# Patient Record
Sex: Female | Born: 1970 | Race: Black or African American | Hispanic: No | Marital: Married | Smoking: Never smoker
Health system: Southern US, Community
[De-identification: ages and names within clinical notes are randomized; demographics above are authoritative.]

## PROBLEM LIST (undated history)

## (undated) DIAGNOSIS — E785 Hyperlipidemia, unspecified: Secondary | ICD-10-CM

## (undated) DIAGNOSIS — N920 Excessive and frequent menstruation with regular cycle: Secondary | ICD-10-CM

## (undated) DIAGNOSIS — R809 Proteinuria, unspecified: Secondary | ICD-10-CM

## (undated) DIAGNOSIS — N051 Unspecified nephritic syndrome with focal and segmental glomerular lesions: Secondary | ICD-10-CM

## (undated) DIAGNOSIS — N184 Chronic kidney disease, stage 4 (severe): Secondary | ICD-10-CM

## (undated) DIAGNOSIS — E559 Vitamin D deficiency, unspecified: Secondary | ICD-10-CM

## (undated) DIAGNOSIS — G43909 Migraine, unspecified, not intractable, without status migrainosus: Secondary | ICD-10-CM

## (undated) DIAGNOSIS — D509 Iron deficiency anemia, unspecified: Secondary | ICD-10-CM

## (undated) DIAGNOSIS — M1990 Unspecified osteoarthritis, unspecified site: Secondary | ICD-10-CM

## (undated) HISTORY — DX: Excessive and frequent menstruation with regular cycle: N92.0

## (undated) HISTORY — DX: Migraine, unspecified, not intractable, without status migrainosus: G43.909

## (undated) HISTORY — DX: Chronic kidney disease, stage 4 (severe): N18.4

## (undated) HISTORY — DX: Unspecified nephritic syndrome with focal and segmental glomerular lesions: N05.1

## (undated) HISTORY — PX: COLONOSCOPY: SHX174

## (undated) HISTORY — DX: Iron deficiency anemia, unspecified: D50.9

## (undated) HISTORY — DX: Unspecified osteoarthritis, unspecified site: M19.90

## (undated) HISTORY — DX: Proteinuria, unspecified: R80.9

## (undated) HISTORY — PX: TUBAL LIGATION: SHX77

## (undated) HISTORY — DX: Hyperlipidemia, unspecified: E78.5

## (undated) HISTORY — DX: Vitamin D deficiency, unspecified: E55.9

---
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
## 1991-06-03 HISTORY — PX: WISDOM TOOTH EXTRACTION: SHX21

## 1999-01-09 ENCOUNTER — Other Ambulatory Visit: Admission: RE | Admit: 1999-01-09 | Discharge: 1999-01-09 | Payer: Self-pay | Admitting: Obstetrics and Gynecology

## 1999-01-10 ENCOUNTER — Other Ambulatory Visit: Admission: RE | Admit: 1999-01-10 | Discharge: 1999-01-10 | Payer: Self-pay | Admitting: *Deleted

## 1999-02-14 ENCOUNTER — Ambulatory Visit (HOSPITAL_COMMUNITY): Admission: RE | Admit: 1999-02-14 | Discharge: 1999-02-14 | Payer: Self-pay | Admitting: Obstetrics & Gynecology

## 2001-12-22 ENCOUNTER — Other Ambulatory Visit: Admission: RE | Admit: 2001-12-22 | Discharge: 2001-12-22 | Payer: Self-pay | Admitting: Obstetrics and Gynecology

## 2003-01-12 ENCOUNTER — Other Ambulatory Visit: Admission: RE | Admit: 2003-01-12 | Discharge: 2003-01-12 | Payer: Self-pay | Admitting: Obstetrics and Gynecology

## 2004-01-30 ENCOUNTER — Ambulatory Visit (HOSPITAL_COMMUNITY): Admission: RE | Admit: 2004-01-30 | Discharge: 2004-01-30 | Payer: Self-pay | Admitting: Obstetrics and Gynecology

## 2004-02-13 ENCOUNTER — Inpatient Hospital Stay (HOSPITAL_COMMUNITY): Admission: AD | Admit: 2004-02-13 | Discharge: 2004-02-17 | Payer: Self-pay | Admitting: Obstetrics and Gynecology

## 2004-03-06 ENCOUNTER — Encounter: Admission: RE | Admit: 2004-03-06 | Discharge: 2004-04-05 | Payer: Self-pay | Admitting: Obstetrics and Gynecology

## 2004-03-06 ENCOUNTER — Other Ambulatory Visit: Admission: RE | Admit: 2004-03-06 | Discharge: 2004-03-06 | Payer: Self-pay | Admitting: Obstetrics and Gynecology

## 2005-03-11 ENCOUNTER — Other Ambulatory Visit: Admission: RE | Admit: 2005-03-11 | Discharge: 2005-03-11 | Payer: Self-pay | Admitting: Obstetrics and Gynecology

## 2006-04-22 ENCOUNTER — Inpatient Hospital Stay (HOSPITAL_COMMUNITY): Admission: RE | Admit: 2006-04-22 | Discharge: 2006-04-25 | Payer: Self-pay | Admitting: Obstetrics and Gynecology

## 2006-04-26 ENCOUNTER — Encounter: Admission: RE | Admit: 2006-04-26 | Discharge: 2006-05-25 | Payer: Self-pay | Admitting: Obstetrics and Gynecology

## 2006-05-26 ENCOUNTER — Encounter: Admission: RE | Admit: 2006-05-26 | Discharge: 2006-06-25 | Payer: Self-pay | Admitting: Obstetrics and Gynecology

## 2006-06-26 ENCOUNTER — Encounter: Admission: RE | Admit: 2006-06-26 | Discharge: 2006-07-26 | Payer: Self-pay | Admitting: Obstetrics and Gynecology

## 2006-07-27 ENCOUNTER — Encounter: Admission: RE | Admit: 2006-07-27 | Discharge: 2006-08-17 | Payer: Self-pay | Admitting: Obstetrics and Gynecology

## 2010-08-26 ENCOUNTER — Other Ambulatory Visit: Payer: Self-pay | Admitting: Obstetrics and Gynecology

## 2010-10-22 ENCOUNTER — Other Ambulatory Visit: Payer: Self-pay | Admitting: Dermatology

## 2011-08-28 ENCOUNTER — Other Ambulatory Visit: Payer: Self-pay | Admitting: Obstetrics and Gynecology

## 2012-01-27 ENCOUNTER — Encounter: Payer: Self-pay | Admitting: Internal Medicine

## 2012-03-05 ENCOUNTER — Encounter: Payer: Self-pay | Admitting: Internal Medicine

## 2012-03-05 ENCOUNTER — Ambulatory Visit (AMBULATORY_SURGERY_CENTER): Payer: BC Managed Care – PPO | Admitting: *Deleted

## 2012-03-05 VITALS — Ht 61.0 in | Wt 135.0 lb

## 2012-03-05 DIAGNOSIS — Z1211 Encounter for screening for malignant neoplasm of colon: Secondary | ICD-10-CM

## 2012-03-05 MED ORDER — SUPREP BOWEL PREP KIT 17.5-3.13-1.6 GM/177ML PO SOLN: ORAL | Status: DC

## 2012-03-18 ENCOUNTER — Encounter: Payer: Self-pay | Admitting: Internal Medicine

## 2012-03-18 ENCOUNTER — Ambulatory Visit (AMBULATORY_SURGERY_CENTER): Payer: BC Managed Care – PPO | Admitting: Internal Medicine

## 2012-03-18 VITALS — BP 111/83 | HR 61 | Temp 98.3°F | Resp 22 | Ht 61.0 in | Wt 135.0 lb

## 2012-03-18 DIAGNOSIS — Z1211 Encounter for screening for malignant neoplasm of colon: Secondary | ICD-10-CM

## 2012-03-18 DIAGNOSIS — Z800 Family history of malignant neoplasm of digestive organs: Secondary | ICD-10-CM

## 2012-03-18 MED ORDER — SODIUM CHLORIDE 0.9 % IV SOLN: 500.0000 mL | INTRAVENOUS | Status: DC

## 2012-03-18 NOTE — Op Note (Signed)
[place]  [address] [place], [address]   COLONOSCOPY PROCEDURE REPORT  PATIENT: Katie, Zamora  MR#: [id number] BIRTHDATE: 1971/04/06 , 41  yrs. old GENDER: Female ENDOSCOPIST: Gatha Mayer, MD, [facility] REFERRED [id number] Tisovec, M.D. PROCEDURE DATE:  03/18/2012 PROCEDURE:   Colonoscopy, screening ASA CLASS:   Class I INDICATIONS:elevated risk screening and patient's immediate family history of colon cancer. MEDICATIONS: propofol (Diprivan) 200mg  IV, MAC sedation, administered by CRNA, and These medications were titrated to patient response per physician's verbal order  DESCRIPTION OF PROCEDURE:   After the risks benefits and alternatives of the procedure were thoroughly explained, informed consent was obtained.  A digital rectal exam revealed no abnormalities of the rectum.   The LB [id number]-[id number]  endoscope was introduced through the anus and advanced to the cecum, which was identified by both the appendix and ileocecal valve. No adverse events experienced.   The quality of the prep was Suprep excellent The instrument was then slowly withdrawn as the colon was fully examined.      COLON FINDINGS: The colonic mucosa appeared normal throughout the entire examined colon.  Retroflexed views revealed no abnormalities. The time to cecum=1 minutes 23 seconds.  Withdrawal time=7 minutes 16 seconds.  The scope was withdrawn and the procedure completed. COMPLICATIONS: There were no complications.  ENDOSCOPIC IMPRESSION: The colonic mucosa appeared normal throughout the entire examined colon - normal colonoscopy  RECOMMENDATIONS: repeat Colonscopy in 10 years. (+ Family history but father > 37 at dx)   eSigned:  Gatha Mayer, MD, [facility] 03/18/2012 10:56 AM   cc: Domenick Gong, MD and The Patient

## 2012-03-18 NOTE — Progress Notes (Signed)
Patient did not experience any of the following events: a burn prior to discharge; a fall within the facility; wrong site/side/patient/procedure/implant event; or a hospital transfer or hospital admission upon discharge from the facility. (G8907) Patient did not have preoperative order for IV antibiotic SSI prophylaxis. (G8918)  

## 2012-03-18 NOTE — Patient Instructions (Addendum)
No polyps or cancer seen! Next colonoscopy in 10 years - 2023.  Thank you for choosing me and [facility].  Gatha Mayer, MD, FACG YOU HAD AN ENDOSCOPIC PROCEDURE TODAY AT [facility]: Refer to the procedure report that was given to you for any specific questions about what was found during the examination.  If the procedure report does not answer your questions, please call your gastroenterologist to clarify.  If you requested that your care partner not be given the details of your procedure findings, then the procedure report has been included in a sealed envelope for you to review at your convenience later.  YOU SHOULD EXPECT: Some feelings of bloating in the abdomen. Passage of more gas than usual.  Walking can help get rid of the air that was put into your GI tract during the procedure and reduce the bloating. If you had a lower endoscopy (such as a colonoscopy or flexible sigmoidoscopy) you may notice spotting of blood in your stool or on the toilet paper. If you underwent a bowel prep for your procedure, then you may not have a normal bowel movement for a few days.  DIET: Your first meal following the procedure should be a light meal and then it is ok to progress to your normal diet.  A half-sandwich or bowl of soup is an example of a good first meal.  Heavy or fried foods are harder to digest and may make you feel nauseous or bloated.  Likewise meals heavy in dairy and vegetables can cause extra gas to form and this can also increase the bloating.  Drink plenty of fluids but you should avoid alcoholic beverages for 24 hours.  ACTIVITY: Your care partner should take you home directly after the procedure.  You should plan to take it easy, moving slowly for the rest of the day.  You can resume normal activity the day after the procedure however you should NOT DRIVE or use heavy machinery for 24 hours (because of the sedation medicines used during the test).     SYMPTOMS TO REPORT IMMEDIATELY: A gastroenterologist can be reached at any hour.  During normal business hours, 8:30 AM to 5:00 PM Monday through Friday, call [phone].  After hours and on weekends, please call the GI answering service at [phone] who will take a message and have the physician on call contact you.   Following lower endoscopy (colonoscopy or flexible sigmoidoscopy):  Excessive amounts of blood in the stool  Significant tenderness or worsening of abdominal pains  Swelling of the abdomen that is new, acute  Fever of 100F or higher   FOLLOW UP: If any biopsies were taken you will be contacted by phone or by letter within the next 1-3 weeks.  Call your gastroenterologist if you have not heard about the biopsies in 3 weeks.  Our staff will call the home number listed on your records the next business day following your procedure to check on you and address any questions or concerns that you may have at that time regarding the information given to you following your procedure. This is a courtesy call and so if there is no answer at the home number and we have not heard from you through the emergency physician on call, we will assume that you have returned to your regular daily activities without incident.  SIGNATURES/CONFIDENTIALITY: You and/or your care partner have signed paperwork which will be entered into your electronic medical record.  These signatures  attest to the fact that that the information above on your After Visit Summary has been reviewed and is understood.  Full responsibility of the confidentiality of this discharge information lies with you and/or your care-partner.   Next colonoscopy 10 years-2023

## 2012-03-19 ENCOUNTER — Telehealth: Payer: Self-pay | Admitting: *Deleted

## 2012-03-19 NOTE — Telephone Encounter (Signed)
No answer left message to call if questions or concerns. 

## 2012-08-31 ENCOUNTER — Other Ambulatory Visit: Payer: Self-pay | Admitting: Obstetrics and Gynecology

## 2013-09-28 ENCOUNTER — Other Ambulatory Visit: Payer: Self-pay | Admitting: Obstetrics and Gynecology

## 2014-11-09 ENCOUNTER — Other Ambulatory Visit: Payer: Self-pay | Admitting: Obstetrics and Gynecology

## 2014-11-10 LAB — CYTOLOGY - PAP

## 2015-04-10 ENCOUNTER — Other Ambulatory Visit: Payer: Self-pay | Admitting: Obstetrics and Gynecology

## 2015-11-15 ENCOUNTER — Other Ambulatory Visit: Payer: Self-pay | Admitting: Obstetrics and Gynecology

## 2015-11-16 LAB — CYTOLOGY - PAP

## 2017-03-16 ENCOUNTER — Other Ambulatory Visit: Payer: Self-pay | Admitting: Nephrology

## 2017-03-16 DIAGNOSIS — R809 Proteinuria, unspecified: Secondary | ICD-10-CM

## 2017-03-18 ENCOUNTER — Ambulatory Visit
Admission: RE | Admit: 2017-03-18 | Discharge: 2017-03-18 | Disposition: A | Discharge status: Home / Self Care | Payer: BLUE CROSS/BLUE SHIELD | Source: Ambulatory Visit | Attending: Nephrology | Admitting: Nephrology

## 2017-03-18 DIAGNOSIS — R809 Proteinuria, unspecified: Secondary | ICD-10-CM

## 2017-04-02 ENCOUNTER — Other Ambulatory Visit (HOSPITAL_COMMUNITY)
Admission: RE | Admit: 2017-04-02 | Discharge: 2017-04-02 | Disposition: A | Discharge status: Home / Self Care | Payer: BLUE CROSS/BLUE SHIELD | Source: Ambulatory Visit | Attending: Nephrology | Admitting: Nephrology

## 2017-04-02 DIAGNOSIS — Z01812 Encounter for preprocedural laboratory examination: Secondary | ICD-10-CM | POA: Insufficient documentation

## 2017-04-02 LAB — APTT: APTT: 34 s (ref 24–36)

## 2017-04-02 LAB — PLATELET FUNCTION ASSAY: Collagen / Epinephrine: 121 seconds (ref 0–193)

## 2017-04-02 LAB — PROTIME-INR
INR: 0.91
Prothrombin Time: 12.2 seconds (ref 11.4–15.2)

## 2017-04-06 ENCOUNTER — Other Ambulatory Visit (HOSPITAL_COMMUNITY): Payer: Self-pay | Admitting: Nephrology

## 2017-04-06 DIAGNOSIS — R809 Proteinuria, unspecified: Secondary | ICD-10-CM

## 2017-04-06 DIAGNOSIS — I10 Essential (primary) hypertension: Secondary | ICD-10-CM

## 2017-04-08 ENCOUNTER — Other Ambulatory Visit: Payer: Self-pay | Admitting: Radiology

## 2017-04-10 ENCOUNTER — Other Ambulatory Visit: Payer: Self-pay | Admitting: Radiology

## 2017-04-13 ENCOUNTER — Encounter (HOSPITAL_COMMUNITY): Payer: Self-pay

## 2017-04-13 ENCOUNTER — Ambulatory Visit (HOSPITAL_COMMUNITY)
Admission: RE | Admit: 2017-04-13 | Discharge: 2017-04-13 | Disposition: A | Discharge status: Home / Self Care | Payer: BLUE CROSS/BLUE SHIELD | Source: Ambulatory Visit | Attending: Nephrology | Admitting: Nephrology

## 2017-04-13 DIAGNOSIS — Z803 Family history of malignant neoplasm of breast: Secondary | ICD-10-CM | POA: Insufficient documentation

## 2017-04-13 DIAGNOSIS — N269 Renal sclerosis, unspecified: Secondary | ICD-10-CM | POA: Insufficient documentation

## 2017-04-13 DIAGNOSIS — Z800 Family history of malignant neoplasm of digestive organs: Secondary | ICD-10-CM | POA: Diagnosis not present

## 2017-04-13 DIAGNOSIS — Z79899 Other long term (current) drug therapy: Secondary | ICD-10-CM | POA: Diagnosis not present

## 2017-04-13 DIAGNOSIS — Z885 Allergy status to narcotic agent status: Secondary | ICD-10-CM | POA: Insufficient documentation

## 2017-04-13 DIAGNOSIS — R809 Proteinuria, unspecified: Secondary | ICD-10-CM | POA: Diagnosis present

## 2017-04-13 DIAGNOSIS — I10 Essential (primary) hypertension: Secondary | ICD-10-CM

## 2017-04-13 DIAGNOSIS — Z880 Allergy status to penicillin: Secondary | ICD-10-CM | POA: Insufficient documentation

## 2017-04-13 LAB — PROTIME-INR
INR: 0.88
Prothrombin Time: 11.8 seconds (ref 11.4–15.2)

## 2017-04-13 LAB — CBC
HEMATOCRIT: 38.1 % (ref 36.0–46.0)
HEMOGLOBIN: 12.4 g/dL (ref 12.0–15.0)
MCH: 28.2 pg (ref 26.0–34.0)
MCHC: 32.5 g/dL (ref 30.0–36.0)
MCV: 86.8 fL (ref 78.0–100.0)
Platelets: 393 10*3/uL (ref 150–400)
RBC: 4.39 MIL/uL (ref 3.87–5.11)
RDW: 15.6 % — AB (ref 11.5–15.5)
WBC: 6.7 10*3/uL (ref 4.0–10.5)

## 2017-04-13 LAB — APTT: APTT: 35 s (ref 24–36)

## 2017-04-13 MED ORDER — FENTANYL CITRATE (PF) 100 MCG/2ML IJ SOLN
INTRAMUSCULAR | Status: AC | PRN
  Administered 2017-04-13: 50 ug via INTRAVENOUS
  Administered 2017-04-13: 25 ug via INTRAVENOUS

## 2017-04-13 MED ORDER — LIDOCAINE HCL (PF) 1 % IJ SOLN
INTRAMUSCULAR | Status: AC
  Filled 2017-04-13: qty 30

## 2017-04-13 MED ORDER — MIDAZOLAM HCL 2 MG/2ML IJ SOLN
INTRAMUSCULAR | Status: AC | PRN
  Administered 2017-04-13: 1 mg via INTRAVENOUS
  Administered 2017-04-13 (×2): 0.5 mg via INTRAVENOUS

## 2017-04-13 MED ORDER — SODIUM CHLORIDE 0.9 % IV SOLN: INTRAVENOUS | Status: DC

## 2017-04-13 MED ORDER — FENTANYL CITRATE (PF) 100 MCG/2ML IJ SOLN
INTRAMUSCULAR | Status: AC
  Filled 2017-04-13: qty 2

## 2017-04-13 MED ORDER — MIDAZOLAM HCL 2 MG/2ML IJ SOLN
INTRAMUSCULAR | Status: AC
  Filled 2017-04-13: qty 2

## 2017-04-13 MED ORDER — SODIUM CHLORIDE 0.9 % IV SOLN
INTRAVENOUS | Status: AC | PRN
  Administered 2017-04-13: 10 mL/h via INTRAVENOUS

## 2017-04-13 NOTE — Procedures (Signed)
Interventional Radiology Procedure Note  Procedure: US guided core biopsy of left kidney  Complications: None  Estimated Blood Loss: < 10 mL  16 G core biopsy x 2 of LP cortex of left kidney.  Venetia Night. Kathlene Cote, M.D Pager:  [phone]

## 2017-04-13 NOTE — Sedation Documentation (Signed)
Called to give report. Nurse unavailable. Will call back 

## 2017-04-13 NOTE — H&P (Signed)
Chief Complaint: Patient was seen in consultation today for renal biopsy at the request of [place]  Referring Physician(s): [place]  Supervising Physician: Aletta Edouard  Patient Status: [facility] - Out-pt  History of Present Illness: Katie Zamora is a 46 y.o. female being worked up for proteinuria. She is referred for random renal biopsy. PMHx, meds, labs, imaging reviewed. Has been NPO this am Feels well  History reviewed. No pertinent past medical history.  Past Surgical History:  Procedure Laterality Date  . APPENDECTOMY  1985  . CESAREAN SECTION  2005  . CESAREAN SECTION W/BTL  2007  . TUBAL LIGATION    . WISDOM TOOTH EXTRACTION  1993    Allergies: Codeine and Penicillins  Medications: Prior to Admission medications   Medication Sig Start Date End Date Taking? Authorizing Provider  atorvastatin (LIPITOR) 10 MG tablet Take 10 mg daily at 6 PM by mouth.   Yes [provider]     Family History  Problem Relation Age of Onset  . Breast cancer Mother   . Colon cancer Father 62  . Colon polyps Maternal Grandmother     Social History   Socioeconomic History  . Marital status: Married    Spouse name: None  . Number of children: None  . Years of education: None  . Highest education level: None  Social Needs  . Financial resource strain: None  . Food insecurity - worry: None  . Food insecurity - inability: None  . Transportation needs - medical: None  . Transportation needs - non-medical: None  Occupational History  . None  Tobacco Use  . Smoking status: Never Smoker  . Smokeless tobacco: Never Used  Substance and Sexual Activity  . Alcohol use: No  . Drug use: No  . Sexual activity: None  Other Topics Concern  . None  Social History Narrative  . None     Review of Systems: A 12 point ROS discussed and pertinent positives are indicated in the HPI above.  All other systems are negative.  Review of  Systems  Vital Signs: BP 131/90   Pulse (!) 58   Temp 98 F (36.7 C) (Oral)   Resp 16   Ht 5\' 2"  (1.575 m)   Wt 143 lb (64.9 kg)   LMP 04/01/2017 (Exact Date) Comment: tubal ligation  SpO2 100%   BMI 26.16 kg/m   Physical Exam  Constitutional: She is oriented to person, place, and time. She appears well-developed. No distress.  HENT:  Head: Normocephalic.  Mouth/Throat: Oropharynx is clear and moist.  Neck: Normal range of motion. No JVD present. No tracheal deviation present.  Cardiovascular: Normal rate, regular rhythm and normal heart sounds.  Pulmonary/Chest: Effort normal and breath sounds normal. No respiratory distress.  Abdominal: Soft. She exhibits no mass. There is no tenderness.  Neurological: She is alert and oriented to person, place, and time.  Skin: Skin is warm and dry.  Psychiatric: She has a normal mood and affect.    Imaging: US Renal  Result Date: 03/18/2017 CLINICAL DATA:  Protein urea without hematuria.  No abdominal pain. EXAM: RENAL / URINARY TRACT ULTRASOUND COMPLETE COMPARISON:  None in PACs FINDINGS: Right Kidney: Length: 11.0 cm. The renal cortical echotexture remains lower than that of the adjacent liver. There is no hydronephrosis nor cystic or solid mass. Left Kidney: Length: 10.2 cm. The renal cortical echotexture is similar to that on the right. There is a shadowing calcification in the lateral aspect of the midpole measuring  1.3 cm in diameter. There is an additional shadowing stone measuring 0.9 cm in diameter that lies in the lower pole. There is no hydronephrosis nor cystic or solid mass. Bladder: Appears normal for degree of bladder distention. Bilateral ureteral jets are observed. IMPRESSION: Nonobstructing stones in the left kidney. Normal appearing renal cortical echotexture bilaterally. No hydronephrosis. Electronically Signed   By: David  Martinique M.D.   On: 03/18/2017 16:37    Labs:  CBC: Recent Labs    04/13/17 0610  WBC 6.7  HGB  12.4  HCT 38.1  PLT 393    COAGS: Recent Labs    04/02/17 1040 04/13/17 0610  INR 0.91 0.88  APTT 34 35    Assessment and Plan: Proteinuria For US guided random renal biopsy Labs ok Risks and benefits discussed with the patient including, but not limited to bleeding, infection, damage to adjacent structures or low yield requiring additional tests. All of the patient's questions were answered, patient is agreeable to proceed. Consent signed and in chart.     Thank you for this interesting consult.  I greatly enjoyed meeting CATALIA MASSETT and look forward to participating in their care.  A copy of this report was sent to the requesting provider on this date.  Electronically Signed: Ascencion Dike, PA-C 04/13/2017, 7:23 AM   I spent a total of 20 minutes in face to face in clinical consultation, greater than 50% of which was counseling/coordinating care for renal biopsy

## 2017-04-13 NOTE — Discharge Instructions (Addendum)

## 2017-04-20 ENCOUNTER — Other Ambulatory Visit: Payer: Self-pay | Admitting: Nephrology

## 2017-04-20 DIAGNOSIS — R109 Unspecified abdominal pain: Secondary | ICD-10-CM

## 2017-04-22 ENCOUNTER — Encounter (HOSPITAL_COMMUNITY): Payer: Self-pay

## 2017-04-27 ENCOUNTER — Encounter (HOSPITAL_COMMUNITY): Payer: Self-pay

## 2017-04-27 ENCOUNTER — Other Ambulatory Visit: Payer: BLUE CROSS/BLUE SHIELD

## 2019-01-20 ENCOUNTER — Other Ambulatory Visit: Payer: Self-pay | Admitting: Obstetrics and Gynecology

## 2019-01-20 DIAGNOSIS — R928 Other abnormal and inconclusive findings on diagnostic imaging of breast: Secondary | ICD-10-CM

## 2019-01-26 ENCOUNTER — Ambulatory Visit
Admission: RE | Admit: 2019-01-26 | Discharge: 2019-01-26 | Disposition: A | Discharge status: Home / Self Care | Payer: BC Managed Care – PPO | Source: Ambulatory Visit | Attending: Obstetrics and Gynecology | Admitting: Obstetrics and Gynecology

## 2019-01-26 ENCOUNTER — Other Ambulatory Visit: Payer: Self-pay | Admitting: Obstetrics and Gynecology

## 2019-01-26 ENCOUNTER — Other Ambulatory Visit: Payer: Self-pay

## 2019-01-26 ENCOUNTER — Ambulatory Visit
Admission: RE | Admit: 2019-01-26 | Discharge: 2019-01-26 | Disposition: A | Discharge status: Home / Self Care | Payer: BLUE CROSS/BLUE SHIELD | Source: Ambulatory Visit | Attending: Obstetrics and Gynecology | Admitting: Obstetrics and Gynecology

## 2019-01-26 DIAGNOSIS — R928 Other abnormal and inconclusive findings on diagnostic imaging of breast: Secondary | ICD-10-CM

## 2019-01-26 DIAGNOSIS — N6489 Other specified disorders of breast: Secondary | ICD-10-CM

## 2019-02-22 IMAGING — US US BIOPSY
1 series · 6 of 6 positions shown · non-contrast
Comparison: none

CLINICAL DATA: Proteinuria and need for renal biopsy.

[Series 1: us biopsy · 0.20mm/px · 6 of 6 slices shown]
[im 1/6]
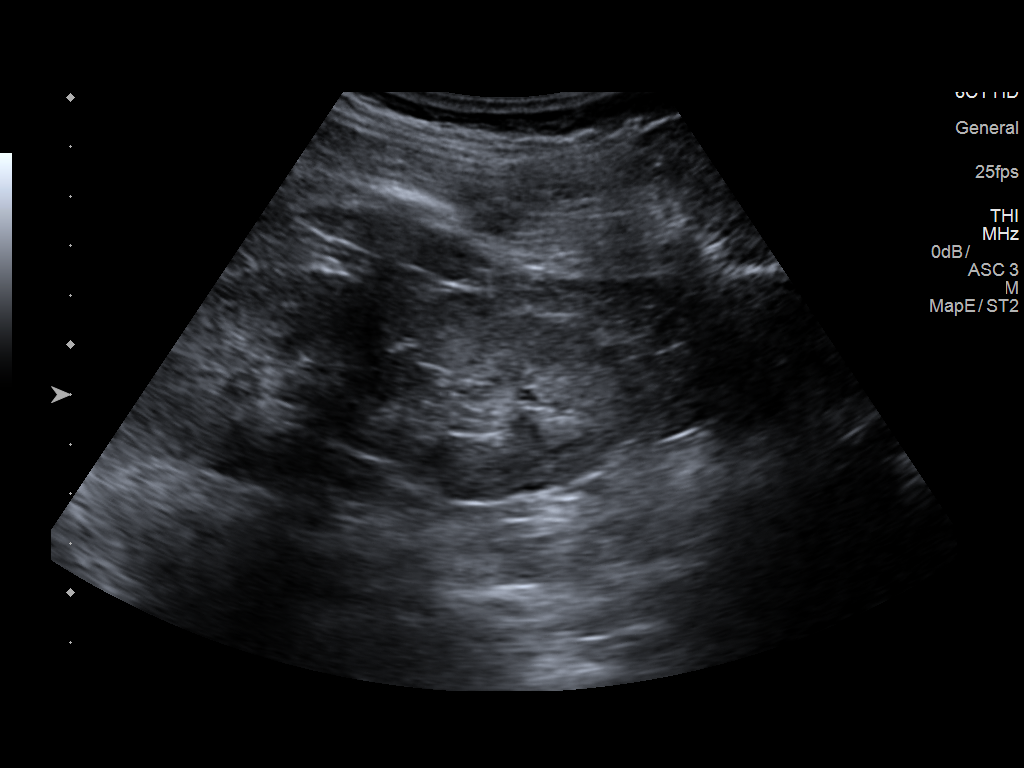
[im 2/6]
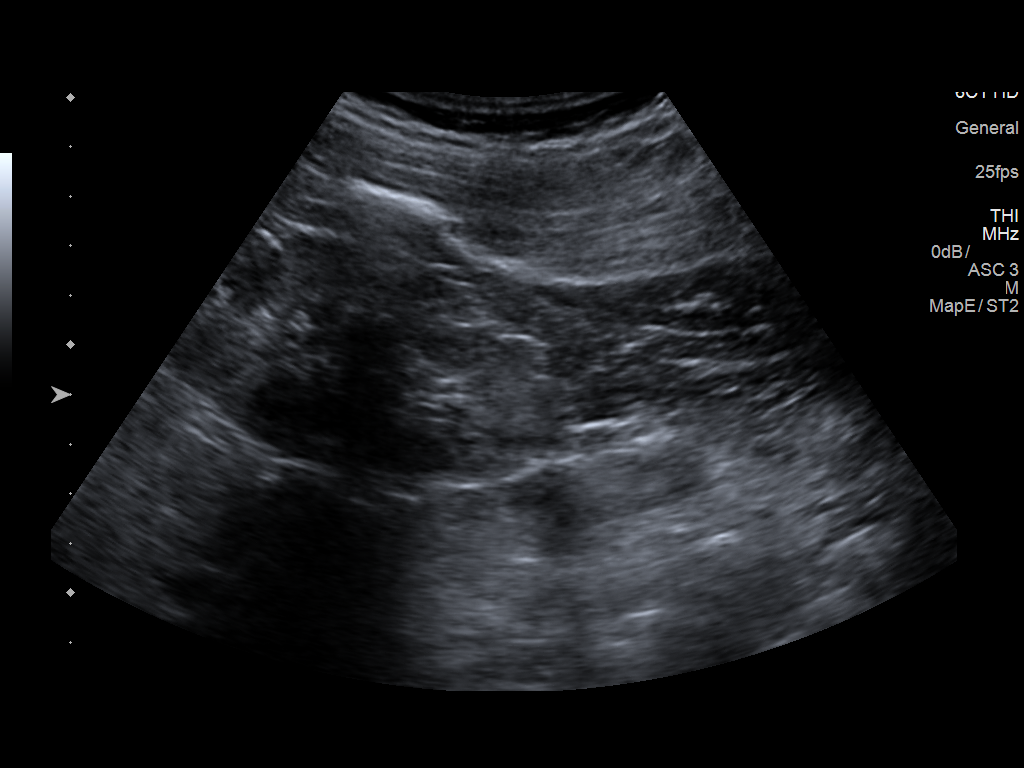
[im 3/6]
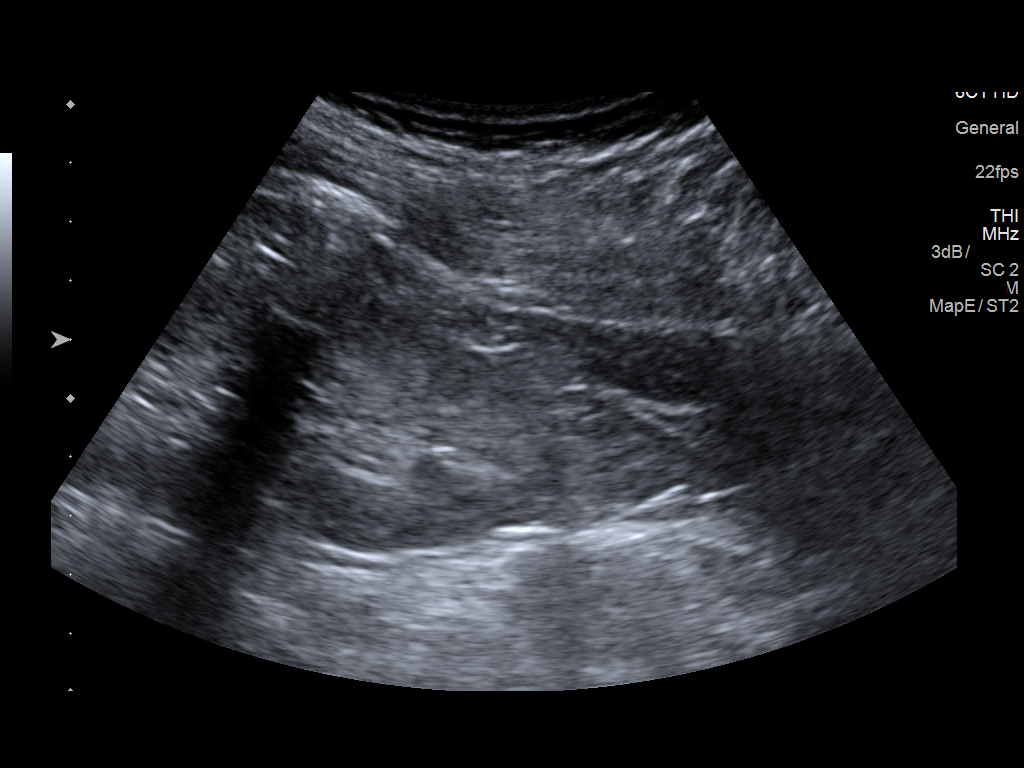
[im 4/6]
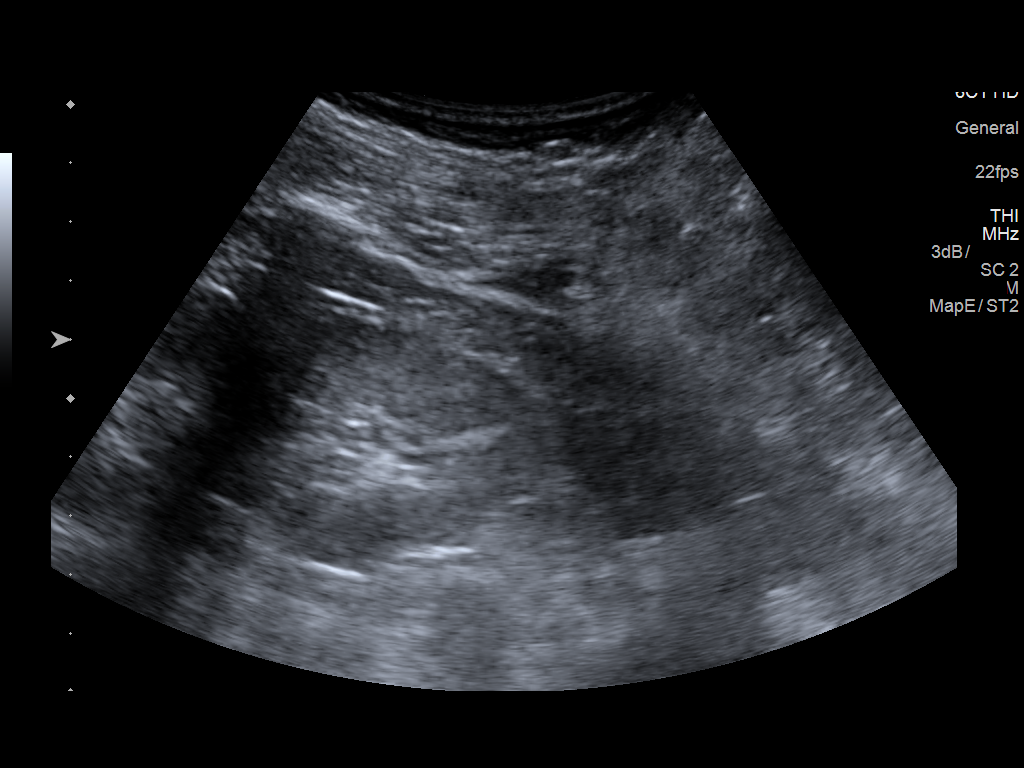
[im 5/6]
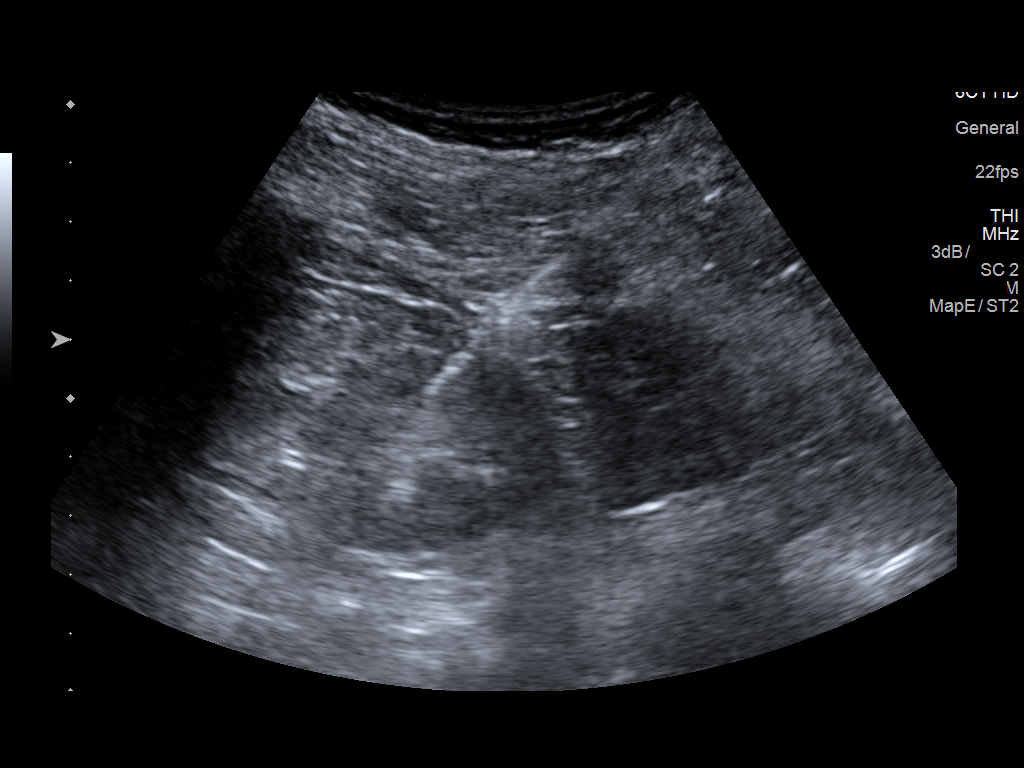
[im 6/6]
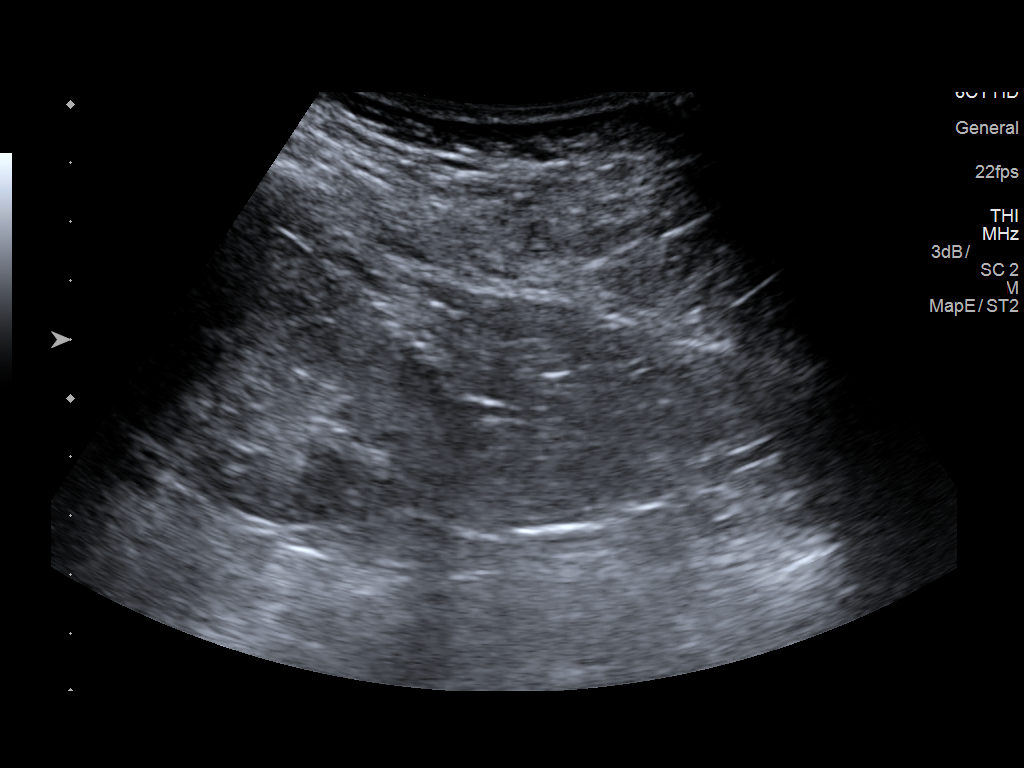

[6 of 6 positions shown; findings below may reference images not displayed]

EXAM:
ULTRASOUND GUIDED CORE BIOPSY OF LEFT KIDNEY

MEDICATIONS:
2.0 mg IV Versed; 75 mcg IV Fentanyl

Total Moderate Sedation Time: 12 minutes.

The patient's level of consciousness and physiologic status were
continuously monitored during the procedure by Radiology nursing.

PROCEDURE:
The procedure, risks, benefits, and alternatives were explained to
the patient. Questions regarding the procedure were encouraged and
answered. The patient understands and consents to the procedure. A
time out was performed prior to initiating the procedure.

Both kidneys were examined by ultrasound. The left was chosen for
biopsy. The left flank region was prepped with chlorhexidine in a
sterile fashion, and a sterile drape was applied covering the
operative field. A sterile gown and sterile gloves were used for the
procedure. Local anesthesia was provided with 1% Lidocaine.

Two separate 16 gauge core biopsy samples were obtained from lower
pole cortex of the left kidney. Core samples were submitted in
saline.

COMPLICATIONS:
None.
FINDINGS: The left kidney was better visualized and more superficially located
compared to the right. Solid core samples were obtained.
IMPRESSION: Ultrasound-guided core biopsy performed of the left kidney at the
level of lower pole cortex.

## 2019-07-25 ENCOUNTER — Ambulatory Visit
Admission: RE | Admit: 2019-07-25 | Discharge: 2019-07-25 | Disposition: A | Discharge status: Home / Self Care | Payer: 59 | Source: Ambulatory Visit | Attending: Obstetrics and Gynecology | Admitting: Obstetrics and Gynecology

## 2019-07-25 ENCOUNTER — Other Ambulatory Visit: Payer: Self-pay

## 2019-07-25 DIAGNOSIS — N6489 Other specified disorders of breast: Secondary | ICD-10-CM

## 2019-08-27 ENCOUNTER — Ambulatory Visit: Payer: 59

## 2020-03-19 ENCOUNTER — Telehealth: Payer: Self-pay | Admitting: Internal Medicine

## 2020-03-19 NOTE — Telephone Encounter (Signed)
Patient will need office visit

## 2020-03-19 NOTE — Telephone Encounter (Signed)
Pt has a recall for a colonoscopy 03/2022, but pt is being referred from [facility] to have a colonoscopy done now for a kidney disease.

## 2020-03-31 ENCOUNTER — Ambulatory Visit: Payer: No Typology Code available for payment source | Attending: Internal Medicine

## 2020-03-31 DIAGNOSIS — Z23 Encounter for immunization: Secondary | ICD-10-CM

## 2020-03-31 NOTE — Progress Notes (Signed)
   Covid-19 Vaccination Clinic  Name:  Katie Zamora    MRN: [id number] DOB: 1971/01/20  03/31/2020  Katie Zamora was observed post Covid-19 immunization for 15 minutes without incident. She was provided with Vaccine Information Sheet and instruction to access the V-Safe system.   Katie Zamora was instructed to call 911 with any severe reactions post vaccine: Marland Kitchen Difficulty breathing  . Swelling of face and throat  . A fast heartbeat  . A bad rash all over body  . Dizziness and weakness

## 2020-06-11 ENCOUNTER — Encounter: Payer: Self-pay | Admitting: Internal Medicine

## 2020-06-11 ENCOUNTER — Ambulatory Visit: Payer: No Typology Code available for payment source | Admitting: Internal Medicine

## 2020-06-11 VITALS — BP 124/78 | HR 70 | Ht 61.0 in | Wt 134.0 lb

## 2020-06-11 DIAGNOSIS — D509 Iron deficiency anemia, unspecified: Secondary | ICD-10-CM | POA: Insufficient documentation

## 2020-06-11 DIAGNOSIS — Z800 Family history of malignant neoplasm of digestive organs: Secondary | ICD-10-CM | POA: Diagnosis not present

## 2020-06-11 DIAGNOSIS — N184 Chronic kidney disease, stage 4 (severe): Secondary | ICD-10-CM | POA: Diagnosis not present

## 2020-06-11 DIAGNOSIS — N921 Excessive and frequent menstruation with irregular cycle: Secondary | ICD-10-CM

## 2020-06-11 MED ORDER — PEG-KCL-NACL-NASULF-NA ASC-C 100 G PO SOLR: 1.0000 | Freq: Once | ORAL | 0 refills | Status: AC

## 2020-06-11 MED ORDER — PEG-KCL-NACL-NASULF-NA ASC-C 100 G PO SOLR: 1.0000 | Freq: Once | ORAL | 0 refills | Status: DC

## 2020-06-11 NOTE — Patient Instructions (Signed)
COLONOSCOPY AND ENDOSCOPY: You have been scheduled for an endoscopy and colonoscopy. Please follow the written instructions given to you at your visit today.  PREP: Please pick up your prep supplies at the pharmacy within the next 1-3 days.  INHALERS: If you use inhalers (even only as needed), please bring them with you on the day of your procedure.  Thank you for trusting me with your gastrointestinal care!    Silvano Rusk, MD, [facility]

## 2020-06-11 NOTE — Progress Notes (Signed)
Katie Zamora 50 y.o. 1970-10-02 [id number] Referred by: Katie Pao, MD  Assessment & Plan:   Encounter Diagnoses  Name Primary?  . Iron deficiency anemia, unspecified iron deficiency anemia type Yes  . Family history of colon cancer in father age 71+   . Chronic kidney disease, stage IV (severe) ([place])   . Menorrhagia with irregular cycle     Schedule EGD and colonoscopy to look for cause of iron deficiency anemia.  Consider small bowel biopsies though it seems unlikely she would have celiac disease its not impossible.   However I suspect her menorrhagia is most likely cause of her iron deficiency anemia but it would be prudent to exclude other causes in her.  The risks and benefits as well as alternatives of endoscopic procedure(s) have been discussed and reviewed. All questions answered. The patient agrees to proceed.  Generic movie prep will be used for preparation this is phosphate 3.  I appreciate the opportunity to care for this patient. CC: Tisovec, Fransico Him, MD  Dr. Warren Zamora [facility] nephrology  Dr. Freda Zamora Subjective:   Chief Complaint: Iron deficiency anemia  HPI The patient is a 50 year old African-American woman with a history of family history of colon cancer in her dad at or above the age of 39 with negative colonoscopy in 2013, but a diagnosis of iron deficiency anemia within the past year.  She is followed at [facility] for focal glomerulosclerosis of the kidneys with stable chronic kidney disease at this point and was noted to be iron deficient and had a new hemoglobin of 10.  She has been treated with iron infusions.  She reports that she had a negative occult blood test through Katie Zamora earlier this year.  Records review shows a hemoglobin of 13.8 on 03/07/2020 after her iron infusion her MCV is normal.  She had an iron saturation of 7% before with ferritin 4 in July of this past year, treated with Feraheme infusions in August    creatinine 2.4 GFR 26 most recently in October 2021.  She does have a history of menorrhagia with irregular cycles followed by Dr. Freda Zamora. Note she had a history of iron deficiency anemia from 2015-2017 as well. She has occasional slight constipation but no bowel habit changes or signs of bleeding.  No unintentional weight loss appetite changes.  Periods are regular without heavy bleeding.  GI review of systems otherwise negative.  Note plan is to refer for renal transplant when GFR is below 20 Allergies  Allergen Reactions  . Codeine Other (See Comments)    Stomach cramps  . Penicillins Hives and Swelling    Has patient had a PCN reaction causing immediate rash, facial/tongue/throat swelling, SOB or lightheadedness with hypotension: Yes Has patient had a PCN reaction causing severe rash involving mucus membranes or skin necrosis: Yes Has patient had a PCN reaction that required hospitalization: No Has patient had a PCN reaction occurring within the last 10 years: No If all of the above answers are "NO", then may proceed with Cephalosporin use.     Current Meds  Medication Sig  . atorvastatin (LIPITOR) 10 MG tablet Take 10 mg daily at 6 PM by mouth.  . olmesartan (BENICAR) 20 MG tablet Take 10 mg by mouth daily.   Past Medical History:  Diagnosis Date  . Chronic kidney disease, stage IV (severe) ([place])   . FSGS (focal segmental glomerulosclerosis)   . Hyperlipidemia   . Iron deficiency anemia   .  Menorrhagia   . Migraine   . Proteinuria   . Vitamin D deficiency    Past Surgical History:  Procedure Laterality Date  . APPENDECTOMY  1985  . CESAREAN SECTION  2005  . CESAREAN SECTION W/BTL  2007  . COLONOSCOPY    . TUBAL LIGATION    . [place] EXTRACTION  1993   Social History   Social History Narrative   She is married, works at [employer], 1 son was born 2007 and a daughter born 2005   No alcohol tobacco or drug use   family history includes  Breast cancer in her mother; Colon cancer (age of onset: 88) in her father; Colon polyps in her maternal grandmother.   Review of Systems See HPI has some back pain and some joint pains at times.  All other review of systems are negative.  Objective:   Physical Exam BP 124/78   Pulse 70   Ht 5\' 1"  (1.549 m)   Wt 134 lb (60.8 kg)   BMI 25.32 kg/m  Eyes anicteric Lungs cta Cor Nl s1s2 no rmg abd soft and NT no HSM/mass BS + Rectal deferred Alert and oriented x 3   Data reviewed includes primary care notes from October 2021 and nephrology note from [facility] January 24, 2020 and labs as above

## 2020-06-14 ENCOUNTER — Telehealth: Payer: Self-pay | Admitting: Internal Medicine

## 2020-06-14 MED ORDER — CLENPIQ 10-3.5-12 MG-GM -GM/160ML PO SOLN: 1.0000 | ORAL | 0 refills | Status: DC

## 2020-06-14 NOTE — Telephone Encounter (Signed)
I have emailed her the new Clenpiq instructions per her request. I confirmed your email.

## 2020-06-14 NOTE — Telephone Encounter (Signed)
I spoke with Dr Carlean Purl and said to switch her to [facility]. I called and told her and confirmed the pharmacy she wanted it sent to. I will email her the instructions.

## 2020-06-14 NOTE — Telephone Encounter (Signed)
[facility] called stating that Moviprep is not covered by [employer]. Insurance cover [employer] and [employer]. They want to know if prescription can be change to either one. Procedure is scheduled on 06/20/20.

## 2020-06-19 ENCOUNTER — Encounter: Payer: Self-pay | Admitting: Internal Medicine

## 2020-06-20 ENCOUNTER — Ambulatory Visit (AMBULATORY_SURGERY_CENTER): Payer: No Typology Code available for payment source | Admitting: Internal Medicine

## 2020-06-20 ENCOUNTER — Other Ambulatory Visit: Payer: Self-pay

## 2020-06-20 ENCOUNTER — Encounter: Payer: Self-pay | Admitting: Internal Medicine

## 2020-06-20 VITALS — BP 116/82 | HR 63 | Temp 97.1°F | Resp 11 | Ht 61.0 in | Wt 134.0 lb

## 2020-06-20 DIAGNOSIS — K449 Diaphragmatic hernia without obstruction or gangrene: Secondary | ICD-10-CM

## 2020-06-20 DIAGNOSIS — Z800 Family history of malignant neoplasm of digestive organs: Secondary | ICD-10-CM

## 2020-06-20 DIAGNOSIS — D509 Iron deficiency anemia, unspecified: Secondary | ICD-10-CM | POA: Diagnosis present

## 2020-06-20 MED ORDER — SODIUM CHLORIDE 0.9 % IV SOLN: 500.0000 mL | Freq: Once | INTRAVENOUS | Status: DC

## 2020-06-20 NOTE — Progress Notes (Signed)
[name] Monday RN  Completed covid vaccines x3

## 2020-06-20 NOTE — Patient Instructions (Addendum)
The only abnormality on these test was some redness and irritation in the stomach.  That may be there just from the preparation for the colonoscopy but sometimes an infection can be associated so I took biopsies to check for a bacterium that can live in the stomach and cause problems.  If you have that, and it is called Helicobacter pylori, I will treat that so it goes away.  Otherwise the tests were all normal.  I suspect your iron is low because of blood loss from menstrual bleeding.  Please be sure to drink plenty of fluids today to rehydrate and protect your kidneys and I will be in touch.  Next routine colonoscopy or other screening test in 5 years given 2 relatives with colon cancer (2027)  I appreciate the opportunity to care for you. Gatha Mayer, MD, [facility]   Handouts given on hiatal hernia and gastritis.  YOU HAD AN ENDOSCOPIC PROCEDURE TODAY: Refer to the procedure report and other information in the discharge instructions given to you for any specific questions about what was found during the examination. If this information does not answer your questions, please call [place] office at [phone] to clarify.   YOU SHOULD EXPECT: Some feelings of bloating in the abdomen. Passage of more gas than usual. Walking can help get rid of the air that was put into your GI tract during the procedure and reduce the bloating. If you had a lower endoscopy (such as a colonoscopy or flexible sigmoidoscopy) you may notice spotting of blood in your stool or on the toilet paper. Some abdominal soreness may be present for a day or two, also.  DIET: Your first meal following the procedure should be a light meal and then it is ok to progress to your normal diet. A half-sandwich or bowl of soup is an example of a good first meal. Heavy or fried foods are harder to digest and may make you feel nauseous or bloated. Drink plenty of fluids but you should avoid alcoholic beverages for 24 hours. If you had a  esophageal dilation, please see attached instructions for diet.    ACTIVITY: Your care partner should take you home directly after the procedure. You should plan to take it easy, moving slowly for the rest of the day. You can resume normal activity the day after the procedure however YOU SHOULD NOT DRIVE, use power tools, machinery or perform tasks that involve climbing or major physical exertion for 24 hours (because of the sedation medicines used during the test).   SYMPTOMS TO REPORT IMMEDIATELY: A gastroenterologist can be reached at any hour. Please call [phone]  for any of the following symptoms:  . Following lower endoscopy (colonoscopy, flexible sigmoidoscopy) Excessive amounts of blood in the stool  Significant tenderness, worsening of abdominal pains  Swelling of the abdomen that is new, acute  Fever of 100 or higher  . Following upper endoscopy (EGD, EUS, ERCP, esophageal dilation) Vomiting of blood or coffee ground material  New, significant abdominal pain  New, significant chest pain or pain under the shoulder blades  Painful or persistently difficult swallowing  New shortness of breath  Black, tarry-looking or red, bloody stools  FOLLOW UP:  If any biopsies were taken you will be contacted by phone or by letter within the next 1-3 weeks. Call [phone]  if you have not heard about the biopsies in 3 weeks.  Please also call with any specific questions about appointments or follow up tests.

## 2020-06-20 NOTE — Op Note (Addendum)
[place] Patient Name: Katie Zamora Procedure Date: 06/20/2020 9:31 AM MRN: [id number] Endoscopist: Gatha Mayer , MD Age: 50 Referring MD:  Date of Birth: 1971-05-30 Gender: Female Account #: [id number] Procedure:                Upper GI endoscopy Indications:              Iron deficiency anemia Medicines:                Propofol per Anesthesia, Monitored Anesthesia Care Procedure:                Pre-Anesthesia Assessment:                           - Prior to the procedure, a History and Physical                            was performed, and patient medications and                            allergies were reviewed. The patient's tolerance of                            previous anesthesia was also reviewed. The risks                            and benefits of the procedure and the sedation                            options and risks were discussed with the patient.                            All questions were answered, and informed consent                            was obtained. Prior Anticoagulants: The patient has                            taken no previous anticoagulant or antiplatelet                            agents. ASA Grade Assessment: II - A patient with                            mild systemic disease. After reviewing the risks                            and benefits, the patient was deemed in                            satisfactory condition to undergo the procedure.                           After obtaining informed consent, the endoscope was  passed under direct vision. Throughout the                            procedure, the patient's blood pressure, pulse, and                            oxygen saturations were monitored continuously. The                            Endoscope was introduced through the mouth, and                            advanced to the second part of duodenum. The upper                            GI  endoscopy was accomplished without difficulty.                            The patient tolerated the procedure well. Scope In: Scope Out: Findings:                 Patchy mildly erythematous mucosa without bleeding                            was found in the prepyloric region of the stomach.                            Biopsies were taken with a cold forceps for                            Helicobacter pylori testing using CLOtest.                            Verification of patient identification for the                            specimen was done. Estimated blood loss was minimal.                           A 1 cm hiatal hernia was present.                           The gastroesophageal flap valve was visualized                            endoscopically and classified as Hill Grade II                            (fold present, opens with respiration).                           The exam was otherwise without                            abnormality.Including retrroflex in  stomach. Complications:            No immediate complications. Estimated Blood Loss:     Estimated blood loss was minimal. Impression:               - Erythematous mucosa in the prepyloric region of                            the stomach. Biopsied.                           - 1 cm hiatal hernia.                           - Gastroesophageal flap valve classified as Hill                            Grade II (fold present, opens with respiration).                           - The examination was otherwise normal. Recommendation:           - Patient has a contact number available for                            emergencies. The signs and symptoms of potential                            delayed complications were discussed with the                            patient. Return to normal activities tomorrow.                            Written discharge instructions were provided to the                            patient.                            - Resume previous diet.                           - Continue present medications.                           - Await pathology results.                           - See the other procedure note for documentation of                            additional recommendations. Gatha Mayer, MD 06/20/2020 10:11:07 AM This report has been signed electronically.

## 2020-06-20 NOTE — Progress Notes (Signed)
Called to room to assist during endoscopic procedure.  Patient ID and intended procedure confirmed with present staff. Received instructions for my participation in the procedure from the performing physician. Clo test performed.

## 2020-06-20 NOTE — Op Note (Signed)
[place] Patient Name: Katie Zamora Procedure Date: 06/20/2020 9:31 AM MRN: [id number] Endoscopist: Gatha Mayer , MD Age: 50 Referring MD:  Date of Birth: 1971/04/26 Gender: Female Account #: [id number] Procedure:                Colonoscopy Indications:              Iron deficiency anemia Medicines:                Propofol per Anesthesia, Monitored Anesthesia Care Procedure:                Pre-Anesthesia Assessment:                           - Prior to the procedure, a History and Physical                            was performed, and patient medications and                            allergies were reviewed. The patient's tolerance of                            previous anesthesia was also reviewed. The risks                            and benefits of the procedure and the sedation                            options and risks were discussed with the patient.                            All questions were answered, and informed consent                            was obtained. Prior Anticoagulants: The patient has                            taken no previous anticoagulant or antiplatelet                            agents. ASA Grade Assessment: II - A patient with                            mild systemic disease. After reviewing the risks                            and benefits, the patient was deemed in                            satisfactory condition to undergo the procedure.                           After obtaining informed consent, the colonoscope  was passed under direct vision. Throughout the                            procedure, the patient's blood pressure, pulse, and                            oxygen saturations were monitored continuously. The                            Olympus [id number]-[id number]) Colonoscope was                            introduced through the anus and advanced to the the                            cecum,  identified by appendiceal orifice and                            ileocecal valve. The quality of the bowel                            preparation was excellent. The colonoscopy was                            performed without difficulty. The patient tolerated                            the procedure well. The bowel preparation used was                            Clenpiq via split dose instruction. Scope In: 9:45:04 AM Scope Out: 9:54:31 AM Scope Withdrawal Time: 0 hours 7 minutes 52 seconds  Total Procedure Duration: 0 hours 9 minutes 27 seconds  Findings:                 The perianal and digital rectal examinations were                            normal.                           The colon (entire examined portion) appeared normal.                           No additional abnormalities were found on                            retroflexion. Complications:            No immediate complications. Estimated blood loss:                            None. Estimated Blood Loss:     Estimated blood loss: none. Impression:               - The entire examined colon is normal. no cause  iron deficiency anemia - suspect from menses.                           - No specimens collected.                           - FHx colon cancer father 50 and a maternal                            grandmother Recommendation:           - Repeat colonoscopy in 5 years for screening                            purposes.                           - Patient has a contact number available for                            emergencies. The signs and symptoms of potential                            delayed complications were discussed with the                            patient. Return to normal activities tomorrow.                            Written discharge instructions were provided to the                            patient.                           - Resume previous diet.                            - Continue present medications. treat H pylori if                            EGD biopsies show it. Gatha Mayer, MD 06/20/2020 10:13:39 AM This report has been signed electronically.

## 2020-06-20 NOTE — Progress Notes (Signed)
A/ox3, pleased with MAC, report to RN 

## 2020-06-21 LAB — HELICOBACTER PYLORI SCREEN-BIOPSY: UREASE: NEGATIVE

## 2020-06-22 ENCOUNTER — Telehealth: Payer: Self-pay | Admitting: *Deleted

## 2020-06-22 NOTE — Telephone Encounter (Signed)
°  Follow up Call-  Call back number 06/20/2020  Post procedure Call Back phone  # [phone]  Permission to leave phone message Yes  Some recent data might be hidden     Patient questions:  Message left to call if necessary.

## 2021-04-29 ENCOUNTER — Other Ambulatory Visit: Payer: Self-pay | Admitting: Obstetrics and Gynecology

## 2021-04-29 DIAGNOSIS — Z1231 Encounter for screening mammogram for malignant neoplasm of breast: Secondary | ICD-10-CM

## 2021-05-31 ENCOUNTER — Ambulatory Visit
Admission: RE | Admit: 2021-05-31 | Discharge: 2021-05-31 | Disposition: A | Discharge status: Home / Self Care | Payer: No Typology Code available for payment source | Source: Ambulatory Visit | Attending: Obstetrics and Gynecology | Admitting: Obstetrics and Gynecology

## 2021-05-31 DIAGNOSIS — Z1231 Encounter for screening mammogram for malignant neoplasm of breast: Secondary | ICD-10-CM

## 2022-05-20 IMAGING — MG MM DIGITAL SCREENING BILAT W/ TOMO AND CAD
8 series · 9 of 24 positions shown · non-contrast
Comparison: Previous exam(s).

CLINICAL DATA: Screening.

EXAM:
DIGITAL SCREENING BILATERAL MAMMOGRAM WITH TOMOSYNTHESIS AND CAD
TECHNIQUE: Bilateral screening digital craniocaudal and mediolateral oblique
mammograms were obtained. Bilateral screening digital breast
tomosynthesis was performed. The images were evaluated with
computer-aided detection.

[L CC synth-2D]
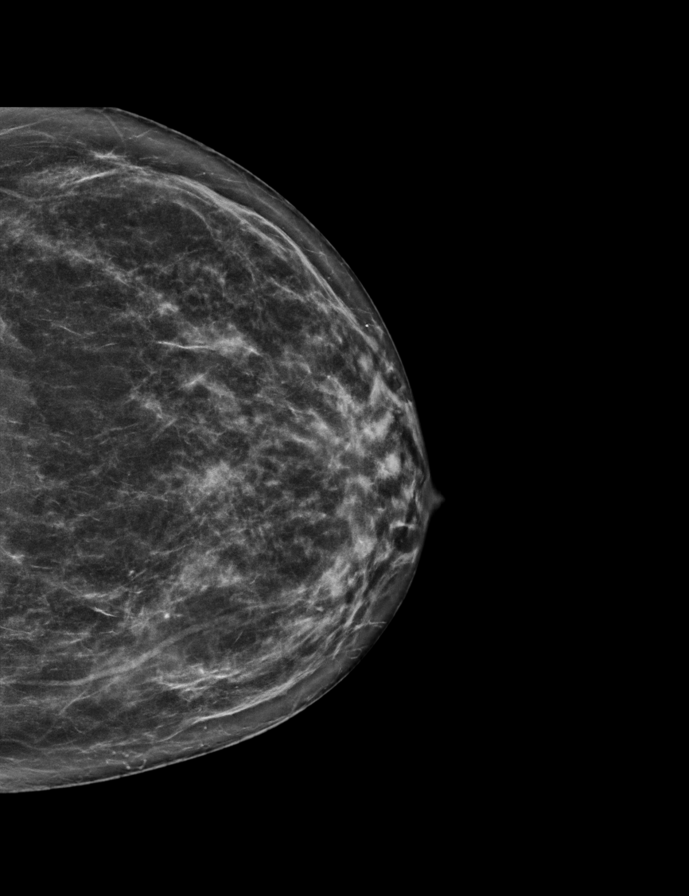

[L MLO synth-2D]
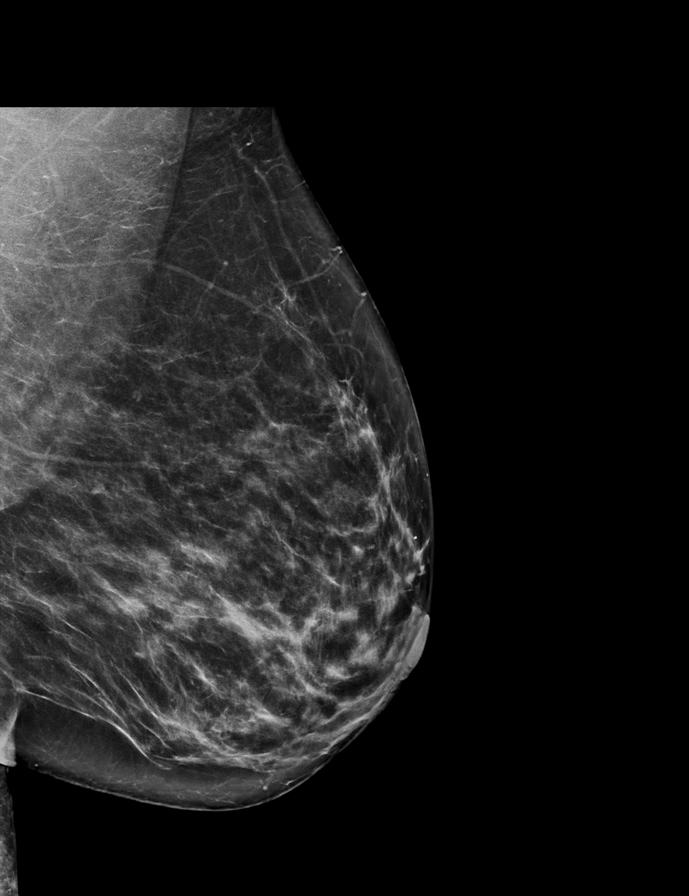

[R MLO synth-2D]
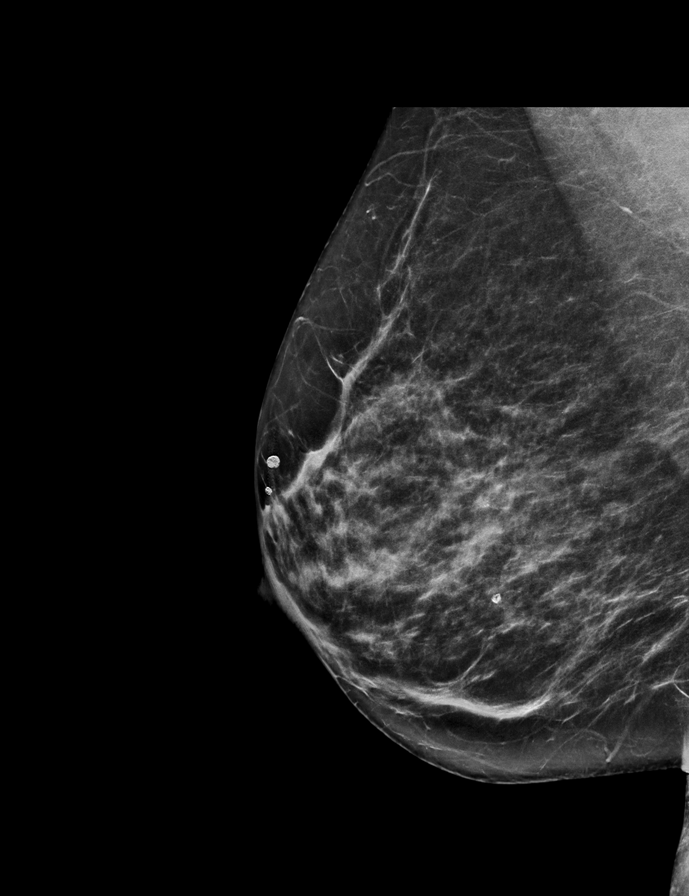

[R CC synth-2D]
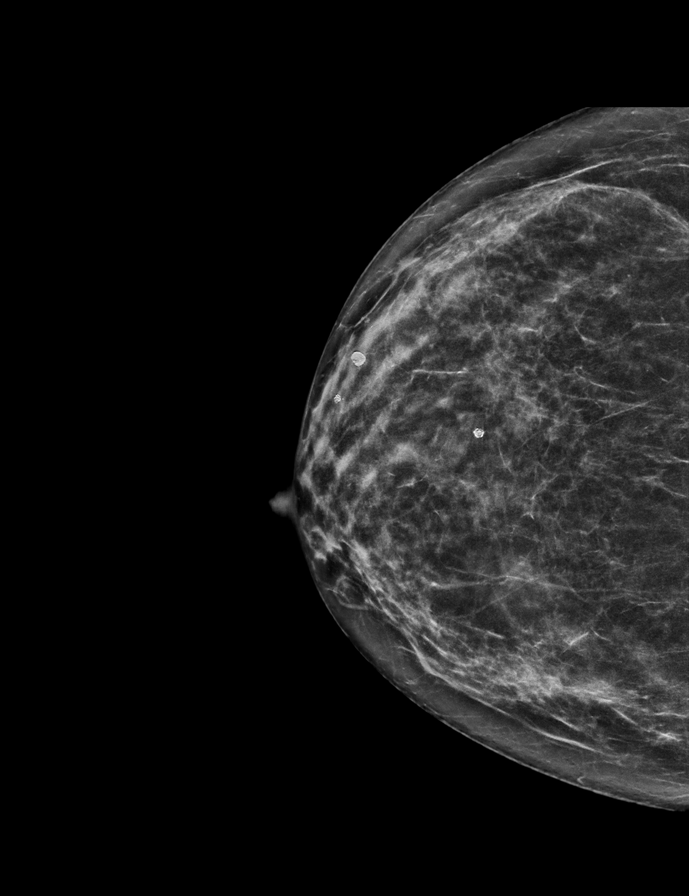

[L MLO tomo · 2 of 69 frames shown]
[frame 23/69]
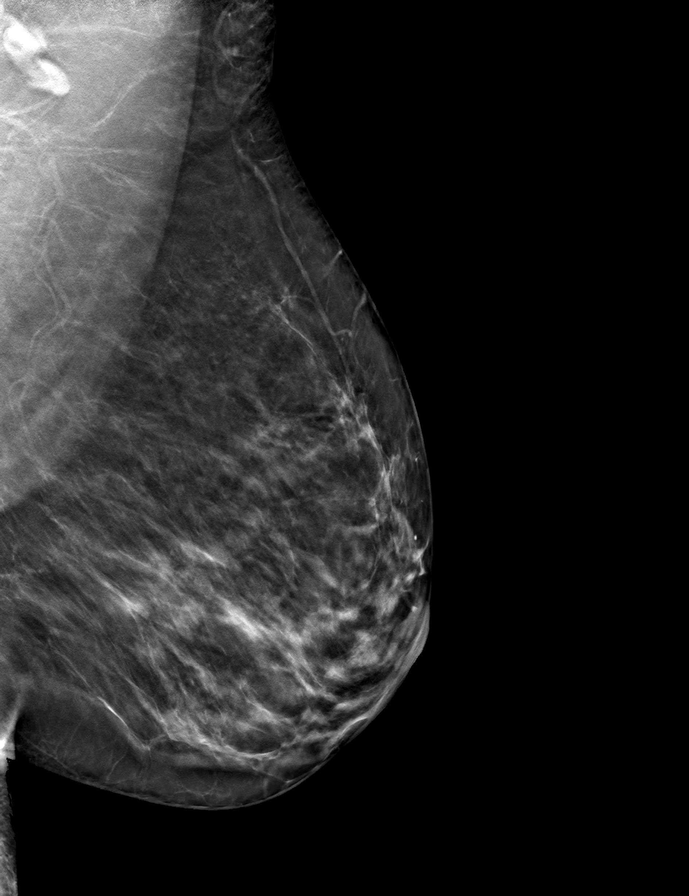
[frame 35/69]
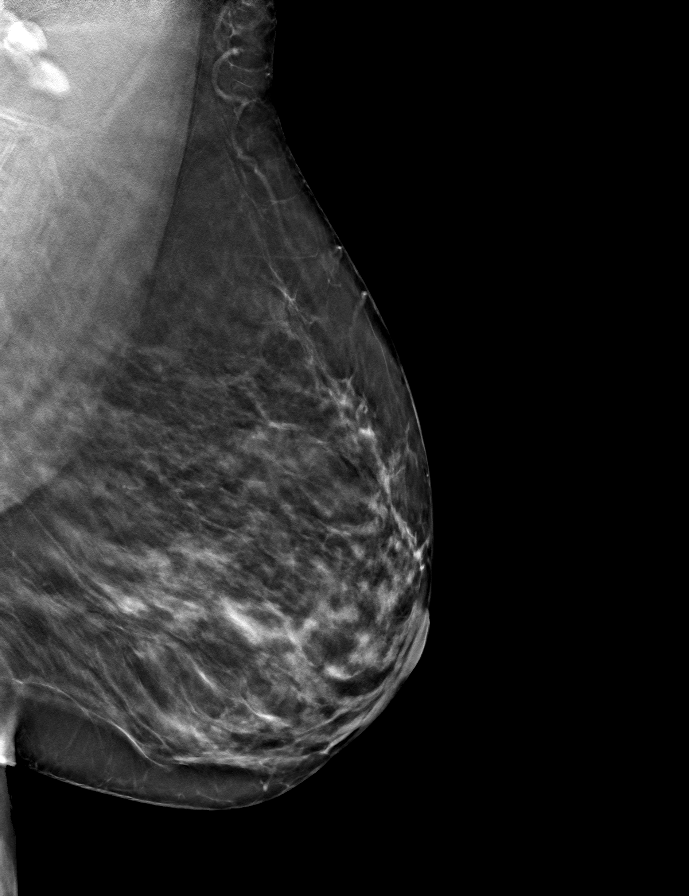

[L CC tomo · tomo slice 30/59.0]
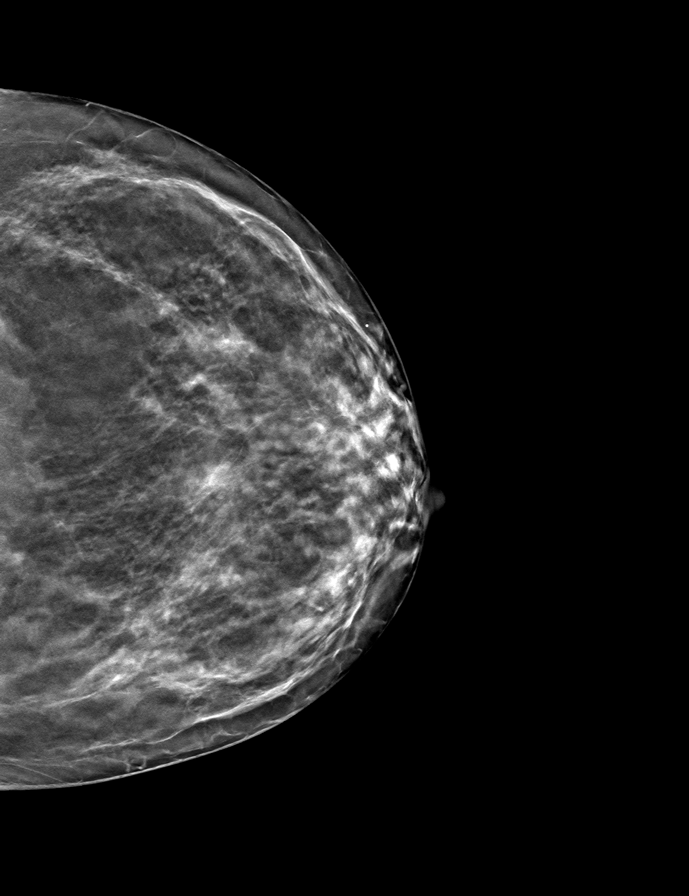

[R CC tomo · tomo slice 30/59.0]
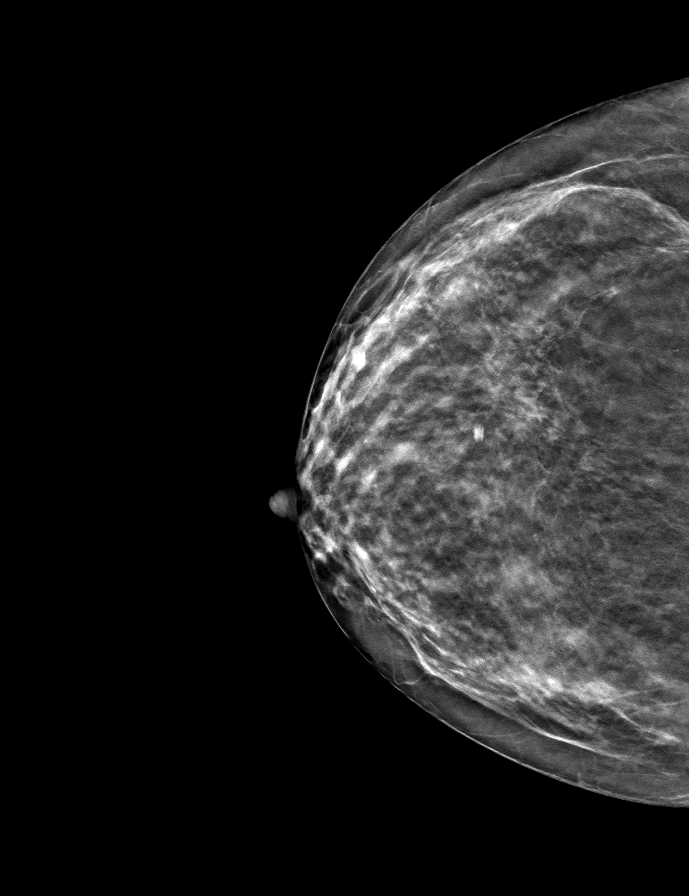

[R MLO tomo · tomo slice 32/63.0]
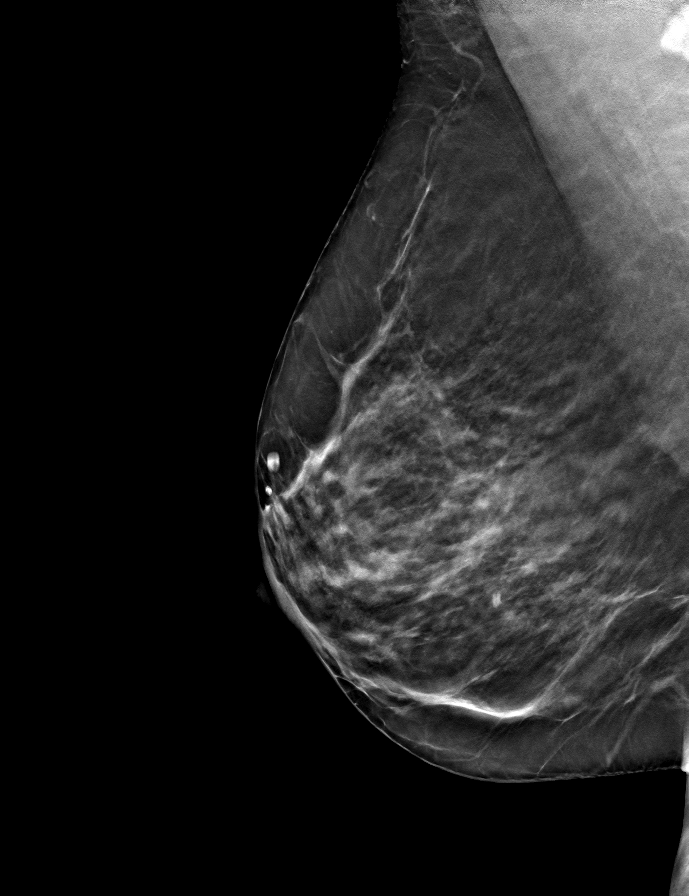

[9 of 24 positions shown; findings below may reference images not displayed]

ACR Breast Density Category c: The breast tissue is heterogeneously
dense, which may obscure small masses.
FINDINGS: There are no findings suspicious for malignancy.
IMPRESSION: No mammographic evidence of malignancy. A result letter of this
screening mammogram will be mailed directly to the patient.

RECOMMENDATION:
Screening mammogram in one year. (Code:Q3-W-BC3)

BI-RADS CATEGORY  1: Negative.
